# Patient Record
Sex: Male | Born: 1950 | Race: White | Hispanic: No | Marital: Married
Health system: Southern US, Community
[De-identification: ages and names within clinical notes are randomized; demographics above are authoritative.]

---

## 2001-04-08 ENCOUNTER — Inpatient Hospital Stay (HOSPITAL_COMMUNITY): Admission: EM | Admit: 2001-04-08 | Discharge: 2001-04-14 | Payer: Self-pay | Admitting: Emergency Medicine

## 2001-05-27 ENCOUNTER — Encounter: Payer: Self-pay | Admitting: *Deleted

## 2001-05-30 ENCOUNTER — Ambulatory Visit (HOSPITAL_COMMUNITY): Admission: RE | Admit: 2001-05-30 | Discharge: 2001-05-30 | Payer: Self-pay | Admitting: *Deleted

## 2001-06-11 ENCOUNTER — Encounter: Payer: Self-pay | Admitting: Emergency Medicine

## 2001-06-11 ENCOUNTER — Emergency Department (HOSPITAL_COMMUNITY): Admission: EM | Admit: 2001-06-11 | Discharge: 2001-06-11 | Payer: Self-pay | Admitting: Emergency Medicine

## 2004-04-25 ENCOUNTER — Inpatient Hospital Stay (HOSPITAL_COMMUNITY): Admission: EM | Admit: 2004-04-25 | Discharge: 2004-04-26 | Payer: Self-pay | Admitting: Emergency Medicine

## 2005-05-20 ENCOUNTER — Emergency Department (HOSPITAL_COMMUNITY): Admission: EM | Admit: 2005-05-20 | Discharge: 2005-05-20 | Payer: Self-pay | Admitting: Emergency Medicine

## 2005-05-22 ENCOUNTER — Emergency Department (HOSPITAL_COMMUNITY): Admission: EM | Admit: 2005-05-22 | Discharge: 2005-05-22 | Payer: Self-pay | Admitting: Family Medicine

## 2007-09-12 ENCOUNTER — Encounter: Admission: RE | Admit: 2007-09-12 | Discharge: 2007-09-12 | Payer: Self-pay | Admitting: Family Medicine

## 2008-09-08 IMAGING — CT CT PELVIS W/ CM
2 of 5 series · 17 of 46 positions shown, 19 images · IV contrast (READICAT/WATER & [ID] OMNI 300)
Comparison: None.

CLINICAL DATA: Weight loss, nausea, vomiting.  Elevated LFTs.
 ABDOMEN CT WITH CONTRAST:
TECHNIQUE: Multidetector CT imaging of the abdomen was performed following the standard protocol during bolus administration of intravenous contrast.
 Contrast:  125 cc of Omnipaque 300.
TECHNIQUE: Multidetector CT imaging of the pelvis was performed following the standard protocol during bolus administration of intravenous contrast.
 The urinary bladder is unremarkable. The prostate is normal in size.  There are scattered colonic diverticula present.  The appendix appears normal.  Small nodes are present scattered throughout the mesentery, none of which are pathologically enlarged.

[Series 2: abdomen w/ · axial · 0.79mm/px · z∈[-413,+7]mm · 14 of 94 slices shown, 16 images]
[im 5/94  soft-tissue]
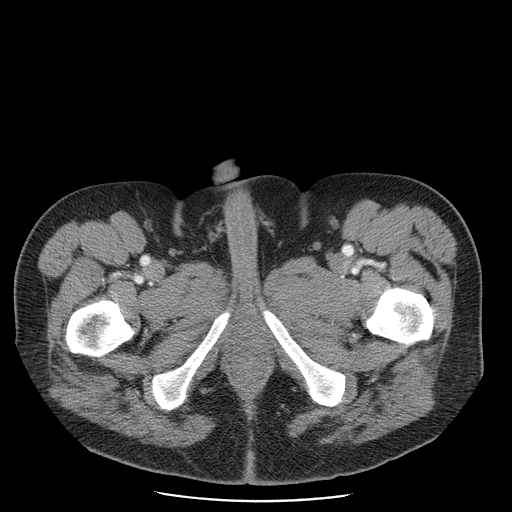
[im 5/94  bone]
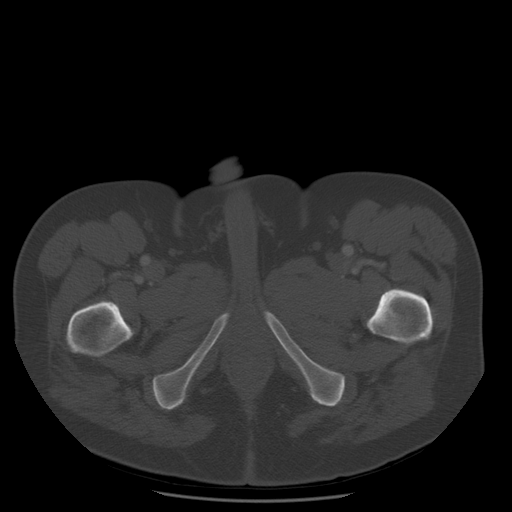
[im 14/94  soft-tissue]
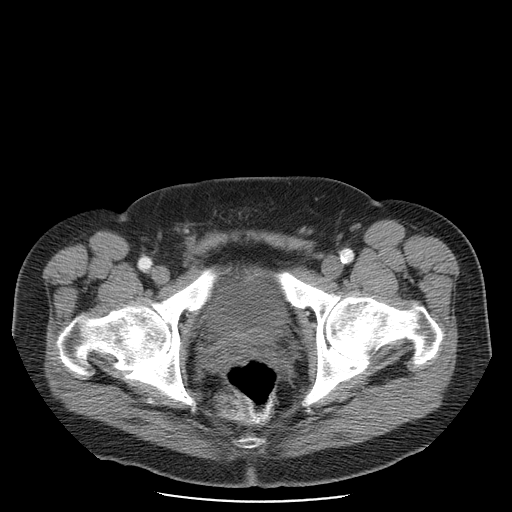
[im 19/94  soft-tissue]
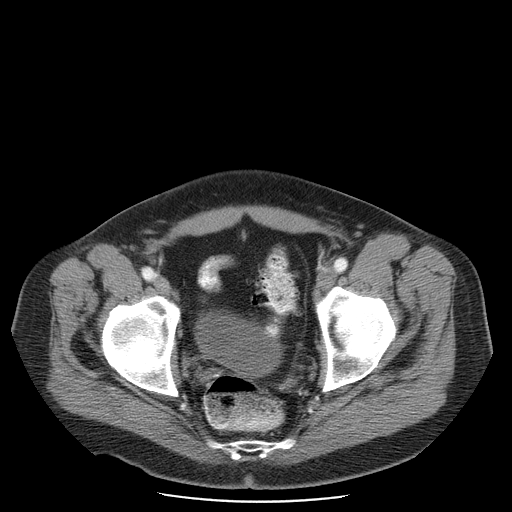
[im 24/94  soft-tissue]
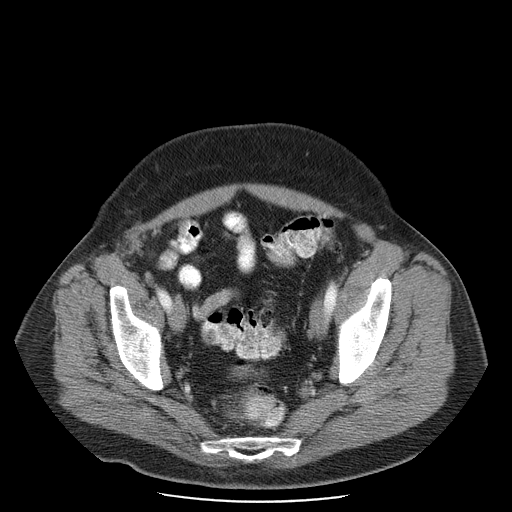
[im 33/94  soft-tissue]
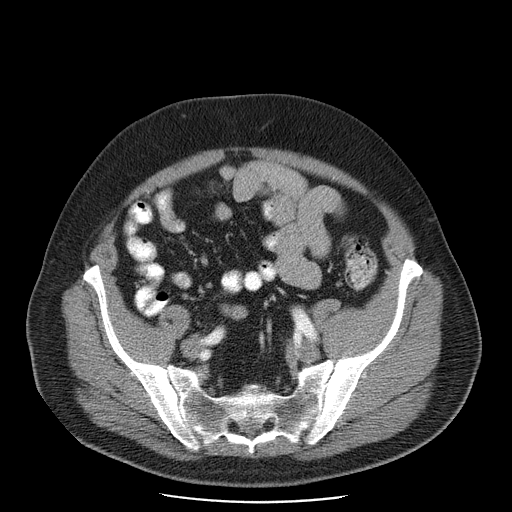
[im 38/94  soft-tissue]
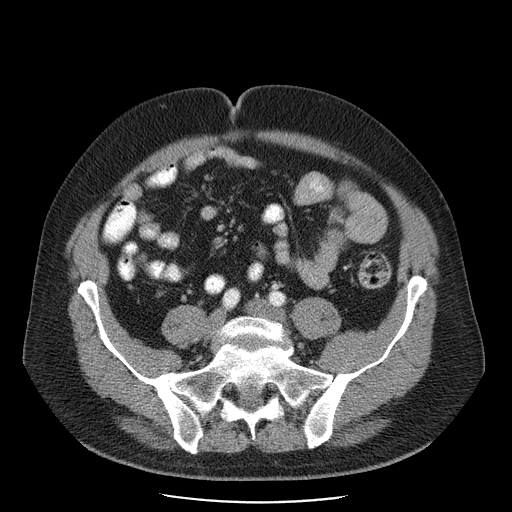
[im 42/94  soft-tissue]
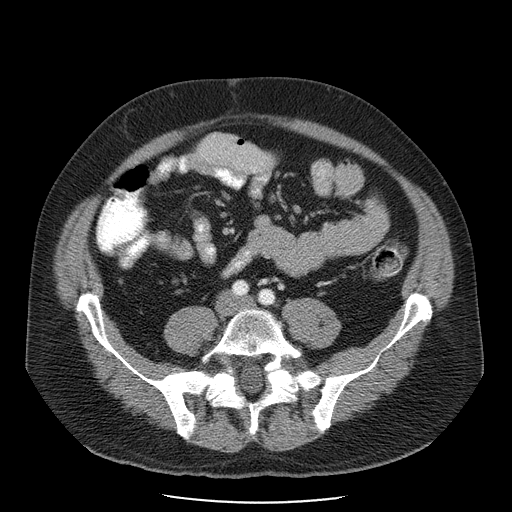
[im 52/94  soft-tissue]
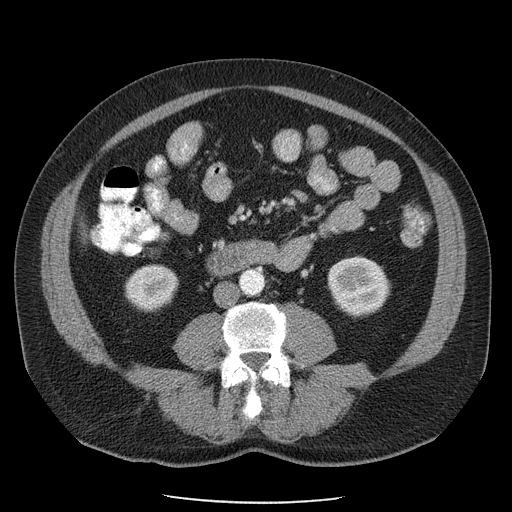
[im 56/94  soft-tissue]
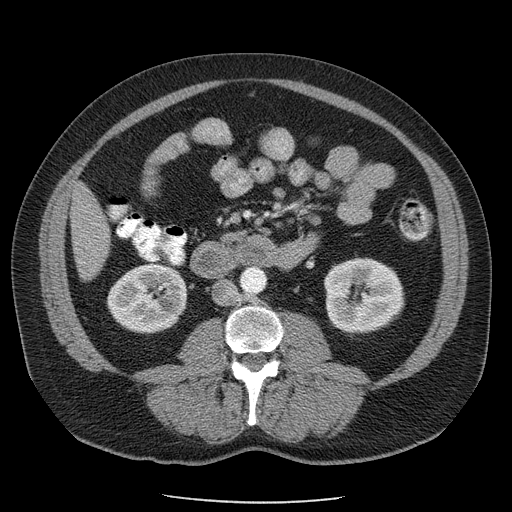
[im 56/94  bone]
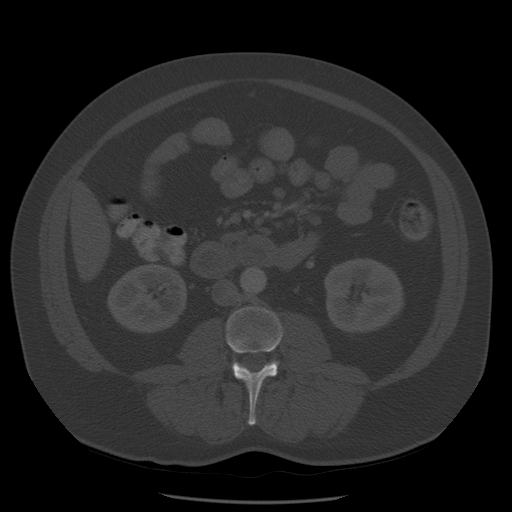
[im 61/94  soft-tissue]
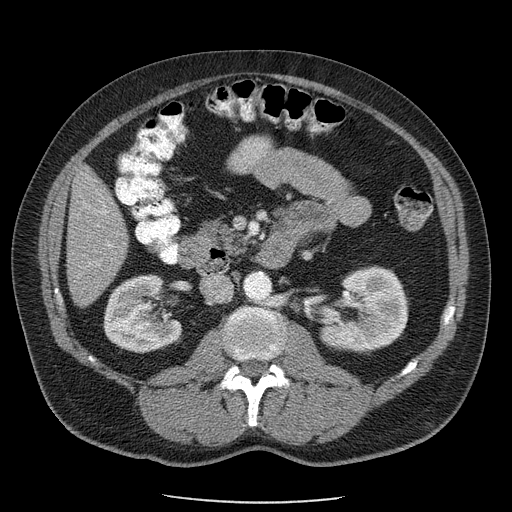
[im 70/94  soft-tissue]
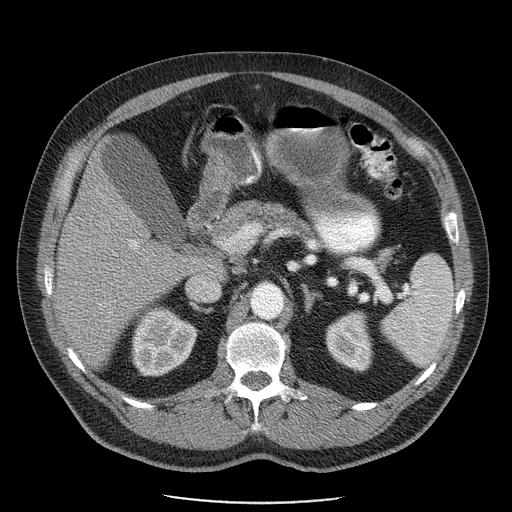
[im 75/94  soft-tissue]
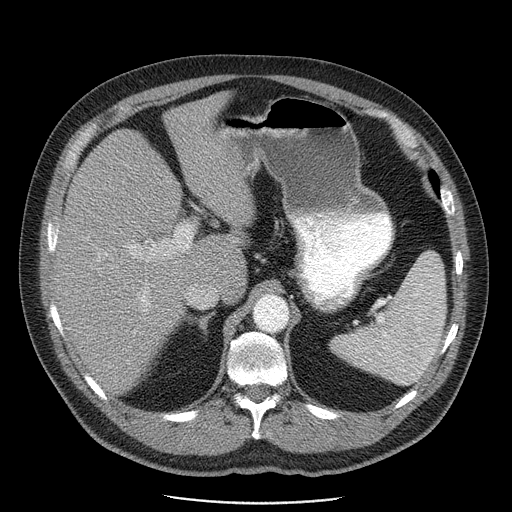
[im 80/94  soft-tissue]
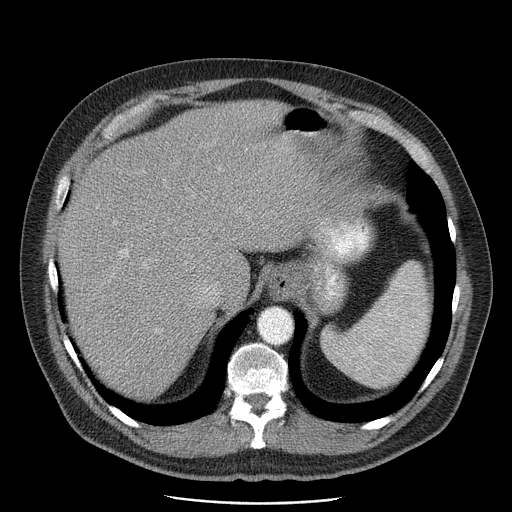
[im 89/94  soft-tissue]
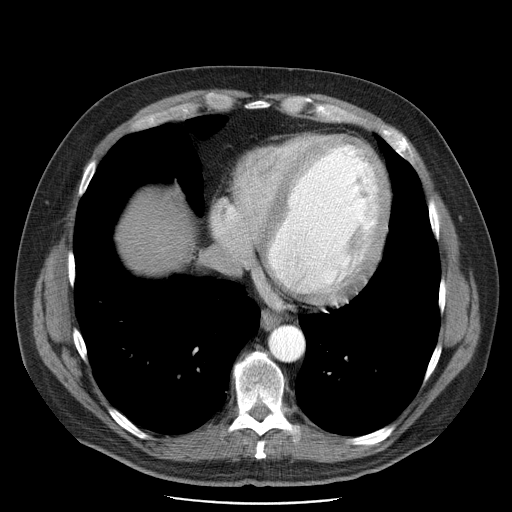

[Series 401: cor · coronal · 0.92mm/px · 3 of 185 slices shown]
[im 62/185  soft-tissue]
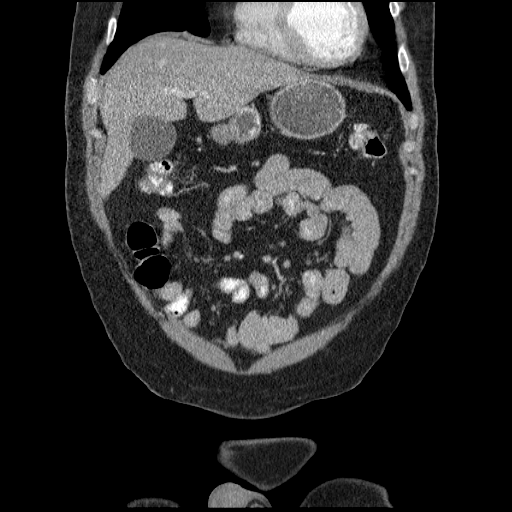
[im 82/185  soft-tissue]
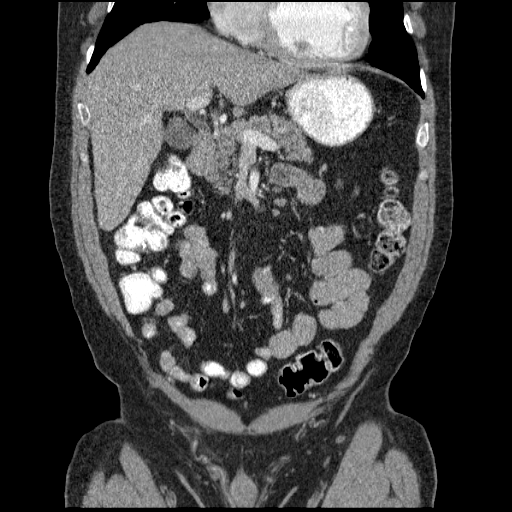
[im 103/185  soft-tissue]
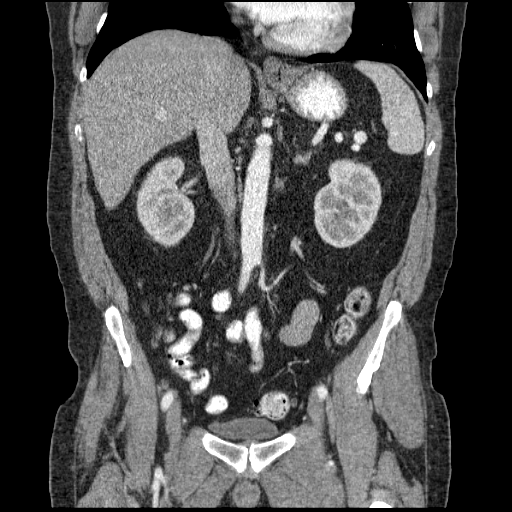

[17 of 46 positions shown; findings below may reference images not displayed]

The lung bases are clear.  The liver is somewhat low in attenuation suggesting mild fatty infiltration.  No ductal dilatation is seen.  No calcified gallstones are noted. The pancreas is normal and the pancreatic duct is not dilated.  There may be a small descending duodenal diverticulum present.  The adrenal glands and spleen appear normal.  The kidneys enhance and there is a low attenuation in the lower right kidney medially.  On delayed images, this persists and has an attenuation of 10 Hounsfield units, most likely representing a cyst.  On delayed images, the pelvocaliceal systems appear normal.  The abdominal aorta is normal in caliber and no adenopathy is seen.
IMPRESSION: No acute abnormality on CT of the abdomen.  Question of mild fatty infiltration of the liver. 
 PELVIS CT WITH CONTRAST:
IMPRESSION: 1.  No pelvic mass or adenopathy.  There are scattered small mesenteric nodes present. 
 2.  Appendix appears normal.  
 3.  Sigmoid colon diverticula.

## 2011-04-13 NOTE — Op Note (Signed)
[place]. [facility]  Patient:    Anthony Browning, Anthony Browning                       MRN: [id number] Proc. Date: 05/30/01 Adm. Date:  [id number] Attending:  Carlena Sax CC:         Meade Maw, M.D.   Operative Report  PREOPERATIVE DIAGNOSIS:  Left Fratus vocal cord paralysis.  POSTOPERATIVE DIAGNOSIS:  Left Hobart vocal cord paralysis.  OPERATION PERFORMED:  Microdirect laryngoscopy.  SURGEON:  Veverly Fells. Arletha Grippe, M.D.  ANESTHESIA:  General endotracheal.  INDICATIONS FOR PROCEDURE:  The patient is a 60 year old white male who has a history of idiopathic cardiomyopathy.  He is being followed by Dr. Candace Cruise.  He does have a two-month history of sudden onset of hoarseness, which he attributed to a viral URI.  A fiberoptic laryngoscopy performed in my office did reveal a left Keinath vocal cord paralysis.  A CT scan of the chest was negative for any pathology in the mediastinum.  MRI of the brain and neck were essentially unremarkable except for some enhancement of the left Eggenberger vocal cord.  Based on his history and physical examination and these MRI findings, I did recommend proceeding with the above noted surgical procedure. I discussed extensively with the family members the risks and benefits of surgery.  I entertained any questions and answered them appropriately. Informed consent has been obtained and the patient presents for the above noted procedure.  OPERATIVE FINDINGS:  Normal laryngeal anatomy without signs of any tumor or neoplasm.  DESCRIPTION OF PROCEDURE:  The patient was brought to the operating room and placed in supine position.  General endotracheal anesthesia was administered via the anesthesiologist without complications.  The patient was administered 1 gm of Ancef IV x 1 and 10 mg of Decadron IV x 1.  His shoulders were placed in a shoulder roll.  His head was placed in a donut.  The head of the table was turned 90 degrees.  The patients  face was draped in a standard fashion. His upper dentition was protected with a mouth guard.  A large Dedo laryngoscope was inserted into the oral cavity.  This was used to carry out a direct laryngoscopy.  The vallecula, base of tongue, both piriform sinuses, postcricoid area appeared normal as did the epiglottis.  Both Kachel vocal cords were then placed into view and were brought into suspension using the Lewy arm on the laryngoscope.  Next, binocular microscopy was performed with the microscope with a 400 mm lens.  Both Sanseverino vocal cords were meticulously inspected under high power magnification.  There are no signs of any neoplasm or abnormalities or even signs of infection.  Therefore, no biopsies were taken.  The laryngoscope was brought out of suspension and removed through the oral cavity without incident.  FLUIDS GIVEN DURING PROCEDURE:  Approximately 600 cc of crystalloid.  ESTIMATED BLOOD LOSS:  None.  SPECIMENS:  None sent.  The patient tolerated the procedure well without complications, was extubated in the operating room and transferred to recovery in stable condition. Sponge, needle and instrument counts were correct at the end of the procedure. Total duration of procedure was approximately one hour.  The patient will be discharged home once he has recovered here in the recovery room.  He will be sent home on no medications and he does have a follow-up appointment for [place] to see him in the office on July 12.  Based on  these findings, I would probably just observe and not proceed with any surgical intervention unless the vocal cord has not spontaneously recovered by a 34-month period.  At that time, if he still has significant hoarseness and his vocal cord has not returned to normal function, then he would be a candidate for a medialization thyroplasty using a Gore-Tex graft implant. DD:  05/30/01 TD:  05/30/01 Job: [id number]

## 2011-04-13 NOTE — Cardiovascular Report (Signed)
[place]. [facility]  Patient:    Anthony Browning, Anthony Browning                         MRN: [id number] Proc. Date: 04/09/01 Attending:  Meade Maw, M.D.                        Cardiac Catheterization  PROCEDURES PERFORMED:  Coronary angiography and right heart catheterization.  INDICATIONS:  Dilated cardiomyopathy with ejection fraction of 20%.  DESCRIPTION OF PROCEDURE:  After obtaining written informed consent, the patient was brought to the cardiac catheterization laboratory in the postabsorptive state.  Preoperative sedation was achieved using p.o. Valium and 1 mg of IV Versed.  The right groin was prepped and draped in the usual sterile fashion.  Local anesthesia was achieved using 1% Xylocaine.  An 8-French hemostasis sheath was placed into the right femoral vein.  A 6-French hemostasis sheath was placed into the right femoral artery.  Right heart catheterization data was obtained.  The Ernestine Conrad was then removed.  Coronary angiography was performed using JL-4 and JR-4 Judkins catheters.  Multiple views were obtained.  The sheath was flushed following each engagement.  Left heart catheterization and single-plane ventriculogram were not performed secondary to possible thrombus which was noted no the transthoracic echocardiogram.  Following the procedure, the hemostasis sheath was removed.  Hemostasis was achieved using digital pressure.  FINDINGS:  HEMODYNAMIC DATA:  The aortic pressure was 90/64.  LV pressures were not obtained.  Right atrial manual was 28.  RV pressure was 63/29, PA pressure 68/45, wedge is 25.  Cardiac output by thermodilution was 5.3.  Cardiac output by Fick was 4.8. Cardiac index by thermodilution was 2.34.  Cardiac index by Fick was 2.1.  The O2 saturation is 96%.  O2 saturations from the pulmonary artery was 64%, from the right ventricle was 68%, from the right atrium was 70%.  There was no significant step-up noted.  CORONARY  ANGIOGRAPHY: 1. The left main coronary artery bifurcated into the left anterior descending    and circumflex vessels.  There was no significant disease in the left main    coronary artery. 2. The left anterior descending coronary artery gave rise to a moderate D-1,    moderate D-2, and it is an apical recurrent.  There was no significant    disease in the left anterior descending coronary artery or its branches. 3. The circumflex vessel was codominant and gave rise to a large bifurcating    OM-1, OM-2, and a PDA branch.  There was no significant disease in the    circumflex or its branches. 4. The right coronary artery was codominant and gave rise to a moderate RV    marginal 1, RV marginal 2 and a PDA.  There was no significant disease in    the right coronary artery.  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  The patient currently has elevated wedge.  PLAN:  We will continue with diuresis.  The patient will be increased on his ACE inhibitor as started.  He will be started on Coreg.  Intake regulation will be initiated with heparin until his INR is therapeutic.  Following medical stabilization, the patient will be referred to [facility] for consideration for a possible transplant. DD:  04/09/01 TD:  04/09/01 Job: [id number]

## 2011-04-13 NOTE — Consult Note (Signed)
NAME:  GIBBS, NAUGLE                          ACCOUNT NO.:  [id number]   MEDICAL RECORD NO.:  [id number]                   PATIENT TYPE:  INP   LOCATION:  2920                                 FACILITY:  [facility]   PHYSICIAN:  Melissa L. Ladona Ridgel, MD               DATE OF BIRTH:  1951-08-15   DATE OF CONSULTATION:  04/25/2004  DATE OF DISCHARGE:                                   CONSULTATION   REASON FOR CONSULTATION:  Nausea and vomiting.   REQUESTING:  Payton Spark. Effie Shy, M.D.   HISTORY OF PRESENT ILLNESS:  The patient is a 60 year old white male with  known dilated idiopathic cardiomyopathy.  This afternoon his ejection  fraction was noted to be 10-20%.  He has severe MR and moderate PR, and  decided that his medications were making him too sleepy, and so he has been  noncompliant with his cardiovascular medications and followup as an  outpatient with Dr. Fraser Din.   The patient states that over the weekend he noticed increased weakness.  He  was not feeling very well and on Saturday did not sleep much that night  until Sunday.  When he got up Sunday morning he felt short of breath and  over the course of the day continued to feel poorly.  The patient and his  family had a cookout that had different items, including hot dogs.  Later  that evening he developed acute onset of nausea and vomiting.  No one else  in the family became sick after eating the hot dogs.  The patient states  that this continued until the next day.  He reported upwards of 20 episodes  of vomiting, and when his symptoms continued to deteriorate and he became  more and more weak he came to the emergency room for evaluation.   The patient was noted to have a heart rate in the 193's on evaluation in the  emergency room.  He was in atrial fibrillation and flutter.  He was sent to  the ICU under cardiology service for better control of his rate, and to  workup his other symptomatology.   PAST MEDICAL HISTORY:  As above  and there is a question of whether or not he  has a 12% left ventricle deficiency and treated him with Coumadin for this.  With the initial diagnosis of heart disease, the patient experienced vocal  cord paralysis, which has since resolved.  He has been seen by ENT for this.  He is referred for cardiac catheterization.   SOCIAL HISTORY:  He denies tobacco.  He denies ethanol.  He works for the  [employer].   FAMILY HISTORY:  Mother deceased at the age of 40, secondary to pneumonia;  she did have cardiac artery disease.  Father is deceased at 63, secondary to  an MI.   ALLERGIES:  NO KNOWN DRUG ALLERGIES.   MEDICATIONS:  He is  not taking any at home, but in the hospital he is  currently on digoxin, Lopressor, amiodarone, a heparin drip, lithium 4 mg IV  p.r.n., Reglan p.r.n. and supplemental O2.   LABORATORY DATA:  White count 13.6, hemoglobin 13.9, hematocrit 41.5,  platelets 337.  Sodium 141, potassium 4.2, chloride 109, BUN 32, creatinine  1.1, glucose 182.  AST mildly elevated at 47.  ALT mildly elevated at about  47, and his total bilirubin mildly elevated at 1.8.  Amylase within normal  limits at 38, lipase is within normal limits at 23.  BNP is 851.8.  His  first cardiac enzymes revealed elevated Troponin at 0.32, with a CK of 96  and MB of 2.9.   CHEST X-RAY:  CHF with mild interstitial pulmonary edema, right greater than  left.   PHYSICAL EXAMINATION:  VITAL SIGNS:  Currently he is febrile at 100.2.  Blood pressure 130/60, pulse ranging from 120's to 150's in atrial  fibrillation. Respiratory rate 20-30, saturations 96%.  GENERAL:  He is an ill-appearing white male with persistent cough.  HEENT:  Normocephalic and atraumatic.  Ashen in color.  Pupils equal, round  and reactive to light.  Extraocular muscles are intact.  His membranes are  moist with a thick mucous over the posterior pharynx.  NECK:  Supple.  There is no JVD and no carotid bruits.  LUNGS:   Right crackles at the bases.  No wheezing.  CARDIOVASCULAR:  Irregular.  S1 and S2 positive.  No S3 or S4.  Systolic  murmur heard best at the left sternal border.  ABDOMEN:  Soft and nontender, nondistended.  Positive bowel sounds.  There  is no hepatosplenomegaly.  EXTREMITIES:  Warm with 2+ pulses.  There is no clubbing, cyanosis or edema.  NEUROLOGIC:  He is awake, alert and oriented.  Cranial nerves II-XII are  intact.  DTRs 5.   ASSESSMENT AND PLAN:  This is 60 year old white male with an idiopathic  dilated cardiomyopathy, who presents with weakness, nausea and vomiting.  He  was found to be in atrial fibrillation/flutter with rates as high 190.  Currently being loaded with digoxin and amiodarone to obtain rate control.  This, however, is noted to have nausea and vomiting and elevated white count  with fever.   1. PULMONARY:  Admitted the patient for cough, thick sputum, nausea and     vomiting and increased white count with increased temperature.  We cannot     rule out an early pneumonia versus the overlay of his congestive heart     failure.  Would like to start ceftriaxone and azithromycin q24h, and     Xopenex 0.63 if not contraindicated with his atrial fibrillation, Humibid     if the patient is able to keep pills down, along with Tessalon Perles for     his cough and/or Tussionex 5 mg p.o. q12h.  He should have blood cultures     x2 sets.  Sputum cultures if possible and a chest x-ray repeated in the     a.m.  Supplemental O2 to keep saturations at 92-98%.  If he becomes more     tachypneic, need further evaluation for possible diuresis and potential     bypass.   1. CARDIOVASCULAR:  The patient is being digitalized and loaded with     amiodarone.  All instructions will be as per cardiology, which will     follow.   1. GASTROINTESTINAL:  The nausea and vomiting as it does not  appear related    to his pancreas .  His liver enzymes were slightly elevated and we will      treat as an intrinsic disorder versus secondary to his current failing     cardiac status.  We will add other Protonix to cover possible reflux as a     source for his mucus and cough.  Will also symptomatically treat him with     Zofran, and allow ice chips and clears at this time.  Symptomatic     treatment until an obvious source for his nausea and vomiting improves     course.   1. GENITOURINARY:  Will check a urinalysis and C&S and follow strict I&Os .   1. ENDOCRINE:  Agree with checking his TSH, also with the increase in     glucose, will check a hemoglobin A1c and consider sliding-scale insulin     if his a.m. levels continue to be greater.    Thank you for allowing [place] to see your patient.  We will continue to follow  along and assist you with other needs as needed.                                              Melissa L. Ladona Ridgel, MD   MLT/MEDQ  D:  04/25/2004  T:  04/25/2004  Job:  [id number]

## 2011-04-13 NOTE — H&P (Signed)
[place]. [facility]  Patient:    Anthony Browning                       MRN: [id number] Adm. Date:  [id number] Attending:  Cathren Laine CC:         Jethro Bastos, M.D.   History and Physical  CHIEF COMPLAINT: Probable cardiomyopathy.  HISTORY OF PRESENT ILLNESS: Anthony Browning is a 60 year old gentleman, referred by Dr. Dorothe Pea for further evaluation of dyspnea and tachyarrhythmia.  Jaydenn states that he has had a viral infection for approximately eight weeks.  He presented to the emergency room in [place] on Sunday and was diagnosed with an upper respiratory infection.  Boyd subsequently drove himself home and made an appointment with Dr. Dorothe Pea.  He was noted to be tachycardic with a heart rate of 120 and to have significant cardiomegaly on his chest x-ray.  REVIEW OF SYSTEMS: [name] states he has had a presyncopal episode on Sunday which followed a coughing exacerbation.  There was associated nausea and mild vague chest pressure.  He denies fevers and chills.  He has had associated diaphoresis.  He states he is unable to ambulate more than ten feet without stopping to rest.  He mowed his grass on Saturday and had to rest prior to completion, which is unusual for him.  Review Of Systems was reviewed and there is no history of headache, change in vision, hearing problems, wheezing, abdominal discomfort, change in bowel or bladder habits, no blood in his stool, and no peripheral edema.  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  PAST SURGICAL HISTORY:  1. Inguinal hernia repair x 4.  2. Bone reconstruction, right foot.  CURRENT MEDICATIONS:  1. Aspirin 81 mg q.d.  2. Vitamin D.  ALLERGIES: No known drug allergies.  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  He has been married for 26 years and has no children.  No history of tobacco,  alcohol, or drugs.  FAMILY HISTORY: Mother is alive at age 43 and in poor health, lives in a nursing home.  Father passed away at age 69 from myocardial infarction.  PHYSICAL EXAMINATION:  GENERAL: Alert, oriented male in mild respiratory distress.  Very pleasant. Orlene Erm is appropriate.  VITAL SIGNS: Blood pressure 134/91, heart rate 122, respiratory rate 20.  He is afebrile.  Oxygen saturation 95%.  HEENT/NECK: Mild neck vein distention.  No carotid bruits.  Good carotid upstrokes.  Thyroid not palpable.  PULMONARY: Breath sounds with mild basilar crackles noted.  No wheezing noted.  CARDIOVASCULAR: Summation gallop, 3/6 holosystolic murmur which radiates to the axilla.  ABDOMEN: Soft and benign.  No unusual pulsations or bruits noted.  No unusual hepatosplenomegaly noted.  EXTREMITIES: No clubbing, cyanosis, or edema.  Distal pulses equal and palpable.  There is a scar over his right forefoot and right inguinal region.  NEUROLOGIC: Motor 5/5 throughout.  Nonfocal.  LABORATORY DATA: ECG reveals sinus tachycardia, left bundle branch block present.  Chest x-ray reveals significant cardiomegaly, increased pulmonary vascularity suggestive of CHF.  WBC 8.5, hematocrit 39.4; platelet count 241,000.  CK 114.  Troponin I is still pending.  Thyroid function tests pending.  B-12 level pending.  Folate level pending.  HIV pending.  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  An echocardiogram will be obtained and if his ejection fraction is less than 40% left and right heart catheterization will be performed.  The risks, benefits, and options were discussed with the patient and he wishes to proceed.  We will initiate Altace at 5 mg p.o. q.d. and Toprol at 50 mg p.o. q.d. DD:  04/08/01 TD:  04/09/01 Job: [id number]

## 2011-04-13 NOTE — Discharge Summary (Signed)
[place]. [facility]  Patient:    Anthony Browning, Anthony Browning                       MRN: [id number] Adm. Date:  [id number] Disc. Date: [id number] Attending:  Meade Maw A Dictator:   Anselm Lis, N.P. CC:         Jethro Bastos, M.D.   Discharge Summary  DATE OF BIRTH:  1951/04/08  PRIMARY CARE [name]:  Jethro Bastos, M.D.  PROCEDURES: 1. (Apr 09, 2001).  A 2-D echocardiogram revealing markedly dilated LV with    severely reduced systolic function, estimated 20%.  Akinesis mid to distal    anteroseptal wall, akinesis of mid to distal anterior wall.  Akinesis of    the entire periapical wall.  Akinesis of the entire inferior wall.  Severe    mitral valvular regurge.  Moderately dilated left atrium 53 mm (reference    range 19 to 40).  Mildly decreased right ventricular function.  Moderate    pulmonary hypertension.  Mild to moderate tricuspid valve regurgitation.    Moderate size left pleural effusion. 2. (Apr 09, 2001).  Coronary angiography revealing normal coronary arteries.  DISCHARGE DIAGNOSES: 1. Dilated idiopathic cardiomyopathy: This is a 60 year old gentleman admitted    after presyncopal episode following a coughing exacerbation.  Notes marked    fatigue with minimal activity.  Diagnosed with upper respiratory infection    prior to admission.  Admitted with tachycardia, heart rate 120.  Noted to    have significant cardiomegaly on his chest x-ray.  A 2-D echo cardiogram    confirmed suspicion for cardiomyopathy with EF in the 10-20% range.    Segmental akinetic wall motion.  Taken for coronary angiography later in    the afternoon revealing normal coronary arteries and hemodynamic data as    follows:  Aortic pressure 90/64.  LV pressure not obtained.  Right atrial    manual was 28.  RV pressure was 63/29.  PA pressure 68/45, wedge was 25.    Cardiac output by thermodilution was 5.3.  Cardiac output by FICK was 4.8.    Cardiac index by  thermodilution was 2.34.  Cardiac index by FICK was 2.1.    O2 sat 96%.  O2 sat for the pulmonary artery was 64%.  For the right    ventricle it was 68%.  The right atrium was 70%.  No significant step up    noted.  The patient was initiated on and was tolerating by the discharge, a    multimedical regimen for dilated cardiomyopathy including ACE inhibitor,    Coreg and diuretic as well as aspirin. He will be discharged home with    plans for referral to [facility] for cardiac transplantation. 2. Systemic anticoagulation secondary to suspected thrombus in left ventricle.    On IV heparin with Coumadin.  Coumadin dosage 10 mg p.o. q.h.s. at the time    of discharge with protime of 21.1, INR of 2.4 and PTT of 140. 3. Valvular cardiac disease by admission 2-D echo:  Severe MR, mild to    moderate tricuspid valve regurgitation. 4. Moderate pulmonary hypertension. 5. Admission CHF; compensated after diuresis. 6. Tachycardia secondary to cardiomyopathy with admission heart rates    approaching 130.  Rate controlled on Coreg by the time of discharge. 7. Left bundle branch block.  DISCHARGE CONDITION:  The patient discharged home in guarded condition.  DISCHARGE MEDICATIONS: 1. Enteric-coated aspirin  81 mg p.o. q.d. 2. Altace 10 mg p.o. q.d. 3. Coreg 6.25 mg one tablet p.o. b.i.d. 4. Lasix 40 mg p.o. q.d. 5. K-Dur 20 mEq p.o. q.d. 6. Coumadin 10 mg p.o. q.h.s.  ACTIVITY:  Do not return to work until after further discussion with Dr. Fraser Din in followup clinic.  Return to work will be discussed at that time.  DIET:  Low-sodium fluid restriction.  WOUND CARE:  Call for swelling or increased tenderness at cath site.  FOLLOWUP: 1. With Dr. Fraser Din, Friday, May 31 at 11:15 a.m. 2. With Dawn, Tuesday, May 21 at 12:15 p.m. 3. In congestive heart failure clinic on Thursday, May 23 at 3 p.m.  HISTORY OF PRESENT ILLNESS:  As detailed above.  In brief, this is an unfortunate 60 year old  gentleman referred by Dr. Dorothe Pea for further evaluation of dyspnea and tachycardia.  The patient had symptoms of viral infection for approximately eight weeks.  Presented to the emergency room in [place] on Sunday prior to admission, was diagnosed with upper respiratory infection.  When evaluated by primary care doctor here in [place], was noted to be tachycardic with a heart rate of 120 and had significant cardiomegaly on chest x-ray.  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  PREVIOUS SURGICAL HISTORY:  Inguinal hernia repair x 4.  Bone reconstruction on right foot.  LABORATORY DATA:  A CBC revealed a hemoglobin of 13.4; 12.0 at the time of discharge.  Hematocrit of 39.5, platelets of 241.  WBC of 8.5.  Admission coags: within normal range.  At the time of discharge, protime 21.1, INR of 2.4, PTT of 140.  Admission sodium 141, K of 3.8, chloride 104, CO2 29, glucose 120, BUN 17, creatinine 1.1 and calcium of 9.2.  First set of cardiac enzymes on admission were negative with CK of 114, MB fraction 2.1 and troponin-I of 0.03.  Thyroxine (T4) was within normal range at 8.4 with reference range of 4.5 to 10.9.  T3 uptake was within normal range at 35.1 with reference range of 22.5 to 37.  TSH was within normal range at 1.266 with reference range 0.350 to 5.500.  Vitamin B-12 was 360 and serum folate 7.4, both within normal range.  HIV was nonreactive.  Admission EKG revealed sinus tachycardia, left bundle branch block.  Chest x-ray reveals significant cardiomegaly, increased pulmonary vascularity suggestive of CHF. DD:  04/14/01 TD:  04/15/01 Job: [id number]

## 2014-05-14 ENCOUNTER — Telehealth: Payer: Self-pay

## 2014-05-14 NOTE — Telephone Encounter (Signed)
Patient died @ Home per Obituary °

## 2014-05-26 DEATH — deceased
